# Patient Record
Sex: Female | Born: 1990 | Race: Asian | Hispanic: No | Marital: Single | Smoking: Never smoker
Health system: Southern US, Community
[De-identification: ages and names within clinical notes are randomized; demographics above are authoritative.]

---
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
## 2012-06-20 ENCOUNTER — Emergency Department (HOSPITAL_COMMUNITY)
Admission: EM | Admit: 2012-06-20 | Discharge: 2012-06-20 | Disposition: A | Discharge status: Home / Self Care | Payer: Self-pay | Source: Home / Self Care | Attending: Family Medicine | Admitting: Family Medicine

## 2012-06-20 ENCOUNTER — Emergency Department (HOSPITAL_COMMUNITY): Payer: Self-pay

## 2012-06-20 ENCOUNTER — Encounter (HOSPITAL_COMMUNITY): Payer: Self-pay | Admitting: *Deleted

## 2012-06-20 DIAGNOSIS — M940 Chondrocostal junction syndrome [Tietze]: Secondary | ICD-10-CM

## 2012-06-20 MED ORDER — TRAMADOL HCL 50 MG PO TABS: 50.0000 mg | ORAL_TABLET | Freq: Four times a day (QID) | ORAL | Status: DC | PRN

## 2012-06-20 MED ORDER — IBUPROFEN 600 MG PO TABS: 600.0000 mg | ORAL_TABLET | Freq: Three times a day (TID) | ORAL | Status: DC | PRN

## 2012-06-20 NOTE — ED Notes (Signed)
C/o  R ant. chest pain onset Sunday when she woke up.  Denies any injury or activity that would have caused chest pain.  Denies cold symptoms.  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  Has SOB when she tries to talk.  She gets excited and talking fast.  No nausea or sweating.  It moves around in her R chest but no radiation.   Pain when she palpates it or moves.

## 2012-06-20 NOTE — ED Provider Notes (Signed)
History     CSN: [id number]  Arrival date & time 06/20/12  1744   First MD Initiated Contact with Patient 06/20/12 1905      Chief Complaint  Patient presents with  . Chest Pain    (Consider location/radiation/quality/duration/timing/severity/associated sxs/prior treatment) HPI Comments: 22 year old female with no significant past medical history. Here complaining of right side upper chest pain for 3 days. Denies direct injury or recent falls. Denies cough or congestion. No fever or chills. Denies shortness of breath. Patient states that she has a history of getting short of breath when she talks fast. Pain is triggered by touching her tender area and with his right arm and shoulder movement. Denies diaphoresis, nausea or dizziness. Denies acid reflux. Denies abdominal pain.   History reviewed. No pertinent past medical history.  History reviewed. No pertinent past surgical history.  History reviewed. No pertinent family history.  History  Substance Use Topics  . Smoking status: Never Smoker   . Smokeless tobacco: Not on file  . Alcohol Use: Not on file    OB History   Grav Para Term Preterm Abortions TAB SAB Ect Mult Living                  Review of Systems  Constitutional: Negative for fever, chills, diaphoresis, activity change, appetite change and fatigue.  Respiratory: Negative for cough, chest tightness, shortness of breath and wheezing.   Cardiovascular: Positive for chest pain. Negative for palpitations and leg swelling.  Gastrointestinal: Negative for nausea, vomiting, abdominal pain and diarrhea.  Endocrine: Negative for cold intolerance, heat intolerance, polydipsia, polyphagia and polyuria.  Genitourinary: Negative for dysuria and flank pain.  Musculoskeletal: Negative for back pain.  Skin: Negative for rash and wound.  Neurological: Negative for dizziness and headaches.  All other systems reviewed and are negative.    Allergies  Review of patient's  allergies indicates no known allergies.  Home Medications   Current Outpatient Rx  Name  Route  Sig  Dispense  Refill  . ibuprofen (ADVIL,MOTRIN) 600 MG tablet   Oral   Take 1 tablet (600 mg total) by mouth every 8 (eight) hours as needed for pain.   20 tablet   0   . traMADol (ULTRAM) 50 MG tablet   Oral   Take 1 tablet (50 mg total) by mouth every 6 (six) hours as needed for pain.   15 tablet   0     BP 140/90  Pulse 85  Temp(Src) 98.4 F (36.9 C) (Oral)  Resp 16  SpO2 100%  LMP 06/16/2012  Physical Exam  Nursing note and vitals reviewed. Constitutional: She is oriented to person, place, and time. She appears well-developed and well-nourished. No distress.  HENT:  Head: Normocephalic and atraumatic.  Mouth/Throat: Oropharynx is clear and moist. No oropharyngeal exudate.  Eyes: Conjunctivae are normal. No scleral icterus.  Neck: Neck supple. No JVD present. No thyromegaly present.  Cardiovascular: Normal rate, regular rhythm, normal heart sounds and intact distal pulses.  Exam reveals no gallop and no friction rub.   No murmur heard. Pulmonary/Chest: Effort normal and breath sounds normal. No respiratory distress. She has no wheezes. She has no rales.  Abdominal: Soft. There is no tenderness.  Musculoskeletal:  There is focal tenderness with palpation over right mid sternocostal area. No crepitus. No bruising. No obvious swelling. Pain worse with right arm/shoulder abduction.   Lymphadenopathy:    She has no cervical adenopathy.  Neurological: She is alert and oriented to person,  place, and time.  Skin: No rash noted. She is not diaphoretic.    ED Course  Procedures (including critical care time)  Labs Reviewed - No data to display No results found.   1. Costochondritis, acute     EKG: Normal sinus rhythm with ventricular rate at 92 beats per minute. No ST or other acute ischemic changes.  MDM  Treated with ibuprofen and tramadol. [facility] to [facility]ablish primary care. Supportive care and red flags that should prompt return to medical attention discussed with patient and provided in writing.         Sharin Grave, MD 06/22/12 [phone]

## 2012-06-20 NOTE — ED Notes (Signed)
[facility] rad tech informed me that pt. refused chest xray.  She said pt. was upset that the doctor did not do anything except press on her chest. Pt. was discharged by [facility] before I had a chance to talk with her about it.

## 2013-04-24 ENCOUNTER — Emergency Department (HOSPITAL_COMMUNITY)
Admission: EM | Admit: 2013-04-24 | Discharge: 2013-04-24 | Disposition: A | Discharge status: Home / Self Care | Payer: Self-pay | Attending: Emergency Medicine | Admitting: Emergency Medicine

## 2013-04-24 ENCOUNTER — Encounter (HOSPITAL_COMMUNITY): Payer: Self-pay | Admitting: Emergency Medicine

## 2013-04-24 DIAGNOSIS — R590 Localized enlarged lymph nodes: Secondary | ICD-10-CM

## 2013-04-24 DIAGNOSIS — J039 Acute tonsillitis, unspecified: Secondary | ICD-10-CM

## 2013-04-24 DIAGNOSIS — R0602 Shortness of breath: Secondary | ICD-10-CM | POA: Insufficient documentation

## 2013-04-24 LAB — MONONUCLEOSIS SCREEN: MONO SCREEN: NEGATIVE

## 2013-04-24 LAB — COMPREHENSIVE METABOLIC PANEL
ALT: 11 U/L (ref 0–35)
AST: 15 U/L (ref 0–37)
Albumin: 4 g/dL (ref 3.5–5.2)
Alkaline Phosphatase: 77 U/L (ref 39–117)
BUN: 11 mg/dL (ref 6–23)
CALCIUM: 9.6 mg/dL (ref 8.4–10.5)
CO2: 26 mEq/L (ref 19–32)
Chloride: 98 mEq/L (ref 96–112)
Creatinine, Ser: 0.61 mg/dL (ref 0.50–1.10)
GFR calc non Af Amer: >90 mL/min (ref >90)
GLUCOSE: 93 mg/dL (ref 70–99)
Potassium: 4.4 mEq/L (ref 3.7–5.3)
SODIUM: 138 meq/L (ref 137–147)
TOTAL PROTEIN: 8.4 g/dL — AB (ref 6.0–8.3)
Total Bilirubin: 0.6 mg/dL (ref 0.3–1.2)

## 2013-04-24 LAB — CBC
HCT: 37.7 % (ref 36.0–46.0)
HEMOGLOBIN: 12.8 g/dL (ref 12.0–15.0)
MCH: 24.3 pg — AB (ref 26.0–34.0)
MCHC: 34 g/dL (ref 30.0–36.0)
MCV: 71.7 fL — ABNORMAL LOW (ref 78.0–100.0)
Platelets: 341 10*3/uL (ref 150–400)
RBC: 5.26 MIL/uL — ABNORMAL HIGH (ref 3.87–5.11)
RDW: 14.1 % (ref 11.5–15.5)
WBC: 12.5 10*3/uL — ABNORMAL HIGH (ref 4.0–10.5)

## 2013-04-24 LAB — RAPID STREP SCREEN (MED CTR MEBANE ONLY): STREPTOCOCCUS, GROUP A SCREEN (DIRECT): NEGATIVE

## 2013-04-24 MED ORDER — DEXAMETHASONE SODIUM PHOSPHATE 10 MG/ML IJ SOLN
10.0000 mg | Freq: Once | INTRAMUSCULAR | Status: AC
  Administered 2013-04-24: 10 mg
  Filled 2013-04-24: qty 1

## 2013-04-24 NOTE — Discharge Instructions (Signed)
If you were given medicines take as directed.  If you are on coumadin or contraceptives realize their levels and effectiveness is altered by many different medicines.  If you have any reaction (rash, tongues swelling, other) to the medicines stop taking and see a physician.   Please follow up as directed and return to the ER or see a physician for new or worsening symptoms.  Thank you. Filed Vitals:   04/24/13 1334  BP: 137/97  Pulse: 80  Temp: 98 F (36.7 C)  TempSrc: Oral  Resp: 22  SpO2: 100%

## 2013-04-24 NOTE — ED Notes (Addendum)
Pt reports sore throat x 5 days and "knots" on side of neck x 3 days. Denies fever/chills/HA but states pain with neck movement lasting 3 days and resolving this AM. Tonsils red, enlarged. States "I feel like I can't breathe if I lay flat." Speaks in complete sentences, NAD.

## 2013-04-24 NOTE — ED Provider Notes (Signed)
CSN: [id number]     Arrival date & time 04/24/13  1330 History   First MD Initiated Contact with Patient 04/24/13 1501     Chief Complaint  Patient presents with  . Sore Throat  . Cyst     (Consider location/radiation/quality/duration/timing/severity/associated sxs/prior Treatment) HPI Comments: 23 yo female with no medical problems presents with sore throat and right neck "knot" for 4-5 days.  No voice change.  No travel or sick contacts.  No hx of sore throat.  No drooling.  Vaccines UTD as a child.  Pt taking tylenol and motrin with relief. Mild sob with lying flat  Patient is a 23 y.o. female presenting with pharyngitis. The history is provided by the patient.  Sore Throat Associated symptoms include shortness of breath. Pertinent negatives include no chest pain, no abdominal pain and no headaches.    No past medical history on file. No past surgical history on file. No family history on file. History  Substance Use Topics  . Smoking status: Never Smoker   . Smokeless tobacco: Not on file  . Alcohol Use: Yes     Comment: occasionally    OB History   Grav Para Term Preterm Abortions TAB SAB Ect Mult Living                 Review of Systems  Constitutional: Negative for fever and chills.  HENT: Positive for sore throat. Negative for congestion, drooling, ear discharge, rhinorrhea, trouble swallowing and voice change.   Respiratory: Positive for shortness of breath.   Cardiovascular: Negative for chest pain.  Gastrointestinal: Negative for vomiting and abdominal pain.  Genitourinary: Negative for dysuria and flank pain.  Musculoskeletal: Negative for back pain, neck pain and neck stiffness.  Skin: Negative for rash.  Neurological: Negative for light-headedness and headaches.      Allergies  Review of patient's allergies indicates no known allergies.  Home Medications  No current outpatient prescriptions on file. BP 137/97  Pulse 80  Temp(Src) 98 F (36.7 C)  (Oral)  Resp 22  SpO2 100%  LMP 04/24/2013 Physical Exam  Nursing note and vitals reviewed. Constitutional: She is oriented to person, place, and time. She appears well-developed and well-nourished.  HENT:  Head: Normocephalic and atraumatic.  3-4+ tonsils, no exudate, mild erythema posterior. No trismus, uvular deviation, unilateral posterior pharyngeal edema or submandibular swelling. Right posterior cervical lymphadenopathy No cyst or abscess appreciated Neck supple, no meningismus   Eyes: Conjunctivae are normal. Right eye exhibits no discharge. Left eye exhibits no discharge.  Neck: Normal range of motion. Neck supple. No tracheal deviation present.  Cardiovascular: Normal rate and regular rhythm.   Pulmonary/Chest: Effort normal and breath sounds normal.  Musculoskeletal: She exhibits no edema.  Neurological: She is alert and oriented to person, place, and time.  Skin: Skin is warm. No rash noted.  Psychiatric: She has a normal mood and affect.    ED Course  Procedures (including critical care time) Labs Review Labs Reviewed  CBC - Abnormal; Notable for the following:    WBC 12.5 (*)    RBC 5.26 (*)    MCV 71.7 (*)    MCH 24.3 (*)    All other components within normal limits  COMPREHENSIVE METABOLIC PANEL - Abnormal; Notable for the following:    Total Protein 8.4 (*)    All other components within normal limits  RAPID STREP SCREEN  CULTURE, GROUP A STREP  MONONUCLEOSIS SCREEN   Imaging Review No results found.  EKG Interpretation None      MDM   Final diagnoses:  Tonsillitis  Cervical lymphadenopathy   Well appearing. No red flags, no stridor, pt does not want pain medicines. Discussed differential.  No signs of PTA, retro abscess or meningitis.  Lymphadenopathy and Tonsillitis as diagnosis. Fup with ENT.  Strict return instructions given. Discussed r/b of CT scan, pt okay with waiting 2-3 days and will return if worsening or no improvement for  imaging at that time. [place] given.  Fup discussed Results and differential diagnosis were discussed with the patient. Close follow up outpatient was discussed, patient comfortable with the plan.   Filed Vitals:   04/24/13 1334  BP: 137/97  Pulse: 80  Temp: 98 F (36.7 C)  TempSrc: Oral  Resp: 22  SpO2: 100%         Enid SkeensJoshua M Shanesha Bednarz, MD 04/24/13 1616

## 2013-04-24 NOTE — ED Notes (Signed)
Called pharmacist to verify IV decadron can be given PO. Pharmacist said this is ok to do.

## 2013-04-26 LAB — CULTURE, GROUP A STREP

## 2015-07-16 ENCOUNTER — Encounter (HOSPITAL_COMMUNITY): Payer: Self-pay | Admitting: Emergency Medicine

## 2015-07-16 ENCOUNTER — Ambulatory Visit (HOSPITAL_COMMUNITY)
Admission: EM | Admit: 2015-07-16 | Discharge: 2015-07-16 | Disposition: A | Discharge status: Home / Self Care | Payer: Self-pay | Attending: Emergency Medicine | Admitting: Emergency Medicine

## 2015-07-16 DIAGNOSIS — T148 Other injury of unspecified body region: Secondary | ICD-10-CM

## 2015-07-16 DIAGNOSIS — W57XXXA Bitten or stung by nonvenomous insect and other nonvenomous arthropods, initial encounter: Secondary | ICD-10-CM

## 2015-07-16 MED ORDER — CEPHALEXIN 500 MG PO CAPS: 500.0000 mg | ORAL_CAPSULE | Freq: Four times a day (QID) | ORAL | Status: DC

## 2015-07-16 MED ORDER — HYDROCORTISONE 1 % EX CREA: TOPICAL_CREAM | CUTANEOUS | Status: DC

## 2015-07-16 MED ORDER — HYDROXYZINE HCL 25 MG PO TABS: 25.0000 mg | ORAL_TABLET | Freq: Four times a day (QID) | ORAL | Status: DC

## 2015-07-16 NOTE — Discharge Instructions (Signed)
DEET Insect Repellent  °DEET is a commonly used insect repellent. DEET is effective against mosquitoes, ticks, and chiggers. DEET is not effective against stinging insects, such as bees and wasps. When mosquitoes or ticks are active, take the following precautions. °· Use DEET according to the directions on the label. °· Wear protective clothing if you are outside in an area where there are weeds, tall grass, or bushes. This includes long pants, socks, and loose-fitting, long-sleeved shirts. Consider spraying DEET on your clothing. Avoid being outdoors in the early evening. This is when mosquitoes are most active. °· Products with a low concentration of DEET (10% to 20%) may be useful in areas with few insects. Higher concentrations of DEET may be needed in areas with many insects. Repellents used on children should not contain more than 30% DEET. Although higher concentrations of DEET (up to 95%) are available for adults, they are not recommended for routine use. Concentrations higher than 50% do not provide additional protection. Depending on the concentration of DEET in a product, it can be effective for about 2 to 6 hours. °· When applying DEET to children, use the lowest concentration that is effective. Ten percent DEET will last approximately 2 to 3 hours, while 30% will last 4 to 5 hours. Do not use DEET on infants younger than 2 months old. Do not apply DEET more often than once a day to children under the age of 2. °· Avoid prolonged or excessive use of DEET. Use it sparingly to cover exposed skin and clothing. Adverse reactions to DEET in the recommended concentrations are uncommon. However, skin irritation can occur in some people. °· Wash all treated skin and clothing with soap and water after returning indoors. °· Do not allow children to apply insect repellent themselves. °· Do not apply DEET near cuts or open wounds. You can apply DEET and sunscreen together. However, it is recommend that you apply  the sunscreen first. °· Do not apply DEET to a child's hands or near a child's eyes and mouth. If DEET is accidentally sprayed in the eyes, wash the eyes out with large amounts of water. °· Store DEET out of the reach of children. °· Most authorities feel that it is safe to use DEET during pregnancy. However, pregnant women should only use insect repellents when they are in areas with a high risk of disease carried by insects (malaria, West Nile virus, encephalitis). °  °This information is not intended to replace advice given to you by your health care provider. Make sure you discuss any questions you have with your health care provider. °  °Document Released: 10/19/2000 Document Revised: 02/14/2014 Document Reviewed: 08/28/2014 °Elsevier Interactive Patient Education ©2016 [employer] ° °

## 2015-07-16 NOTE — ED Provider Notes (Signed)
CSN: [id number]     Arrival date & time 07/16/15  1811 History   First MD Initiated Contact with Patient 07/16/15 1902     Chief Complaint  Patient presents with  . Rash   (Consider location/radiation/quality/duration/timing/severity/associated sxs/prior Treatment) HPI History obtained from patient:  Pt presents with the cc of:  Bug bites Duration of symptoms: One day Treatment prior to arrival: No treatment Context: Awoke this morning with multiple bug bites on the left arm and leg itching Other symptoms include: Itching Pain score: No pain FAMILY HISTORY: No significant family history    History reviewed. No pertinent past medical history. History reviewed. No pertinent past surgical history. No family history on file. Social History  Substance Use Topics  . Smoking status: Never Smoker   . Smokeless tobacco: None  . Alcohol Use: Yes     Comment: occasionally    OB History    No data available     Review of Systems  Denies: HEADACHE, NAUSEA, ABDOMINAL PAIN, CHEST PAIN, CONGESTION, DYSURIA, SHORTNESS OF BREATH  Allergies  Review of patient's allergies indicates no known allergies.  Home Medications   Prior to Admission medications   Medication Sig Start Date End Date Taking? Authorizing Provider  cephALEXin (KEFLEX) 500 MG capsule Take 1 capsule (500 mg total) by mouth 4 (four) times daily. 07/16/15   Tharon AquasFrank C Atul Delucia, PA  hydrocortisone cream 1 % Apply to affected area 2 times daily 07/16/15   Tharon AquasFrank C Tyee Vandevoorde, PA  hydrOXYzine (ATARAX/VISTARIL) 25 MG tablet Take 1 tablet (25 mg total) by mouth every 6 (six) hours. 07/16/15   Tharon AquasFrank C Cason Dabney, PA   Meds Ordered and Administered this Visit  Medications - No data to display  BP 141/98 mmHg  Pulse 77  Temp(Src) 98.1 F (36.7 C) (Oral)  SpO2 99% No data found.   Physical Exam NURSES NOTES AND VITAL SIGNS REVIEWED. CONSTITUTIONAL: Well developed, well nourished, no acute distress HEENT: normocephalic, atraumatic EYES:  Conjunctiva normal NECK:normal ROM, supple, no adenopathy PULMONARY:No respiratory distress, normal effort ABDOMINAL: Soft, ND, NT BS+, No CVAT MUSCULOSKELETAL: Normal ROM of all extremities,  SKIN: warm and dry several red bug bites noted on the left arm and left leg. No signs of cellulitis. PSYCHIATRIC: Mood and affect, behavior are normal  ED Course  Procedures (including critical care time)  Labs Review Labs Reviewed - No data to display  Imaging Review No results found.   Visual Acuity Review  Right Eye Distance:   Left Eye Distance:   Bilateral Distance:    Right Eye Near:   Left Eye Near:    Bilateral Near:       It is unclear where the bug bites originated but I expect patient to make full recovery Prescription for Keflex and Atarax is provided.  MDM   1. Bug bites     Patient is reassured that there are no issues that require transfer to higher level of care at this time or additional tests. Patient is advised to continue home symptomatic treatment. Patient is advised that if there are new or worsening symptoms to attend the emergency department, contact primary care provider, or return to UC. Instructions of care provided discharged home in stable condition.    THIS NOTE WAS GENERATED USING A VOICE RECOGNITION SOFTWARE PROGRAM. ALL REASONABLE EFFORTS  WERE MADE TO PROOFREAD THIS DOCUMENT FOR ACCURACY.  I have verbally reviewed the discharge instructions with the patient. A printed AVS was given to the patient.  All questions  were answered prior to discharge.      Tharon Aquas, [place] 07/16/15 2034

## 2015-07-16 NOTE — ED Notes (Signed)
Patient c/o rash that began last night on her left forearm. It has now spread to her back and abdomen, she has not looked at her legs but has felt itchy. Patient reports she has not had any changes to her diet, no new medications, or hygiene products in the last few days. Denies SOB or difficulty breathing. She is in NAD.

## 2017-01-16 ENCOUNTER — Other Ambulatory Visit: Payer: Self-pay

## 2017-01-16 ENCOUNTER — Encounter (HOSPITAL_COMMUNITY): Payer: Self-pay

## 2017-01-16 ENCOUNTER — Emergency Department (HOSPITAL_COMMUNITY)
Admission: EM | Admit: 2017-01-16 | Discharge: 2017-01-17 | Disposition: A | Discharge status: Home / Self Care | Payer: Self-pay | Attending: Emergency Medicine | Admitting: Emergency Medicine

## 2017-01-16 DIAGNOSIS — O209 Hemorrhage in early pregnancy, unspecified: Secondary | ICD-10-CM | POA: Insufficient documentation

## 2017-01-16 DIAGNOSIS — Z3A01 Less than 8 weeks gestation of pregnancy: Secondary | ICD-10-CM | POA: Insufficient documentation

## 2017-01-16 DIAGNOSIS — O9989 Other specified diseases and conditions complicating pregnancy, childbirth and the puerperium: Secondary | ICD-10-CM | POA: Insufficient documentation

## 2017-01-16 DIAGNOSIS — Z3201 Encounter for pregnancy test, result positive: Secondary | ICD-10-CM

## 2017-01-16 DIAGNOSIS — J029 Acute pharyngitis, unspecified: Secondary | ICD-10-CM | POA: Insufficient documentation

## 2017-01-16 LAB — RAPID STREP SCREEN (MED CTR MEBANE ONLY): Streptococcus, Group A Screen (Direct): NEGATIVE

## 2017-01-16 LAB — POC URINE PREG, ED: Preg Test, Ur: POSITIVE — AB

## 2017-01-16 MED ORDER — IBUPROFEN 400 MG PO TABS
600.0000 mg | ORAL_TABLET | Freq: Once | ORAL | Status: AC
  Administered 2017-01-16: 600 mg via ORAL
  Filled 2017-01-16: qty 1

## 2017-01-16 NOTE — Discharge Instructions (Addendum)
You had a positive pregnancy test today. Your HCG level was 48. You will need to have a follow up HCG quantitative test performed in 2-3 days. [facility].  Your rapid strep test was negative. Your symptoms are consistent with a viral illness. Viruses do not require antibiotics. Treatment is symptomatic care and it is important to note that these symptoms may last for 7-14 days.   Hand washing: Wash your hands throughout the day, but especially before and after touching the face, using the restroom, sneezing, coughing, or touching surfaces that have been coughed or sneezed upon. Hydration: Symptoms will be intensified and complicated by dehydration. Dehydration can also extend the duration of symptoms. Drink plenty of fluids and get plenty of rest. You should be drinking at least half a liter of water an hour to stay hydrated. Electrolyte drinks (ex. Gatorade, Powerade, Pedialyte) are also encouraged. You should be drinking enough fluids to make your urine light yellow, almost clear. If this is not the case, you are not drinking enough water. Please note that some of the treatments indicated below will not be effective if you are not adequately hydrated. Pain or fever: Tylenol for pain or fever.  Congestion: Saline sinus rinses and saline nasal sprays may also help relieve congestion. Sore throat: Warm liquids or Chloraseptic spray may help soothe a sore throat. Gargle twice a day with a salt water solution made from a half teaspoon of salt in a cup of warm water.  Follow up: Follow up with a primary care provider, as needed, for any future management of this issue.  Return: Return to the ED should symptoms worsen.

## 2017-01-16 NOTE — ED Provider Notes (Signed)
[facility] Provider Note   CSN: [id number] Arrival date & time: 01/16/17  2246     History   Chief Complaint Chief Complaint  Patient presents with  . Sore Throat    HPI [name] is a 26 y.o. female.  HPI   [name] is a 26 y.o. female, patient with no pertinent past medical history, presenting to the ED with sore throat for the last 5 days.  Pain is sharp, bilateral but worse on the left, minor when at rest, but 10/10 with swallowing.   She has been drinking warm tea with honey as well as gargling with salt water.  Recurrent sore throats.  Denies known fever, cough, N/V/D, difficulty breathing or swallowing, drooling, chest pain, abdominal pain, rashes, or any other complaints.  Last normal menstrual cycle was Nov 30, 2016. Denies current method of contraception. Endorses intermittent spotting vaginal bleeding beginning Nov 24, which she states is abnormal for her. States her last sexual contact was "about a month ago."  History reviewed. No pertinent past medical history.  There are no active problems to display for this patient.   History reviewed. No pertinent surgical history.  OB History    No data available       Home Medications    Prior to Admission medications   Not on File    Family History History reviewed. No pertinent family history.  Social History Social History   Tobacco Use  . Smoking status: Never Smoker  Substance Use Topics  . Alcohol use: Yes    Comment: occasionally   . Drug use: No     Allergies   Patient has no known allergies.   Review of Systems Review of Systems  Constitutional: Negative for fever.  HENT: Positive for sore throat. Negative for facial swelling, trouble swallowing and voice change.   Respiratory: Negative for cough and shortness of breath.   Cardiovascular: Negative for chest pain.  Gastrointestinal: Negative for abdominal pain, diarrhea, nausea and vomiting.  All  other systems reviewed and are negative.    Physical Exam Updated Vital Signs BP (!) 143/104 (BP Location: Right Arm)   Pulse 98   Temp (!) 100.9 F (38.3 C) (Oral)   Resp 16   Ht 5\' 4"  (1.626 m)   Wt 63.5 kg (140 lb)   LMP 01/16/2017 (Exact Date)   SpO2 100%   BMI 24.03 kg/m   Physical Exam  Constitutional: She appears well-developed and well-nourished. No distress.  HENT:  Head: Normocephalic and atraumatic.  Mouth/Throat: Uvula is midline and mucous membranes are normal. No trismus in the jaw. Posterior oropharyngeal edema and posterior oropharyngeal erythema present. No oropharyngeal exudate.  Swelling to the posterior oropharynx equal bilaterally.  No trismus.  No submental or submandibular swelling.  Patient handles oral secretions without difficulty.  Mouth opening to at least 3 finger widths.  Eyes: Conjunctivae are normal.  Neck: Normal range of motion. Neck supple.  Cardiovascular: Normal rate, regular rhythm, normal heart sounds and intact distal pulses.  Pulmonary/Chest: Effort normal and breath sounds normal. No respiratory distress.  Abdominal: Soft. There is no tenderness. There is no guarding.  Musculoskeletal: She exhibits no edema.  Lymphadenopathy:    She has cervical adenopathy.  Neurological: She is alert.  Skin: Skin is warm and dry. She is not diaphoretic.  Psychiatric: She has a normal mood and affect. Her behavior is normal.  Nursing note and vitals reviewed.    ED Treatments / Results  Labs (all labs ordered are listed, but only abnormal results are displayed) Labs Reviewed  HCG, QUANTITATIVE, PREGNANCY - Abnormal; Notable for the following components:      Result Value   hCG, Beta Chain, Quant, S 48 (*)    All other components within normal limits  BASIC METABOLIC PANEL - Abnormal; Notable for the following components:   Sodium 133 (*)    Chloride 100 (*)    Glucose, Bld 103 (*)    All other components within normal limits  CBC WITH  DIFFERENTIAL/PLATELET - Abnormal; Notable for the following components:   WBC 21.8 (*)    MCV 72.9 (*)    MCH 24.8 (*)    Neutro Abs 16.6 (*)    Monocytes Absolute 2.6 (*)    All other components within normal limits  POC URINE PREG, ED - Abnormal; Notable for the following components:   Preg Test, Ur POSITIVE (*)    All other components within normal limits  RAPID STREP SCREEN (NOT AT [facility])  CULTURE, GROUP A STREP [facility])  ABO/RH    EKG  EKG Interpretation None       Radiology No results found.  Procedures Procedures (including critical care time)  Medications Ordered in ED Medications  ibuprofen (ADVIL,MOTRIN) tablet 600 mg (600 mg Oral Given 01/16/17 2347)     Initial Impression / Assessment and Plan / ED Course  I have reviewed the triage vital signs and the nursing notes.  Pertinent labs & imaging results that were available during my care of the patient were reviewed by me and considered in my medical decision making (see chart for details).  Clinical Course as of Jan 17 126  Tue Jan 17, 2017  0008 Discussed positive pregnancy test with patient.   [SJ]    Clinical Course User Index [SJ] Joy, Shawn C, PA-C     Patient presents with sore throat.  Bilateral swelling without evidence of trismus.  Low suspicion for abscess.  Rapid strep negative.   Urine pregnancy positive.  Rh+.  HCG quant of 48.  Hemoglobin normal.  Repeat quant in 2-3 days. The patient was given instructions for home care as well as return precautions. Patient voices understanding of these instructions, accepts the plan, and is comfortable with discharge.  Findings and plan of care discussed with Dione Boozeavid Glick, MD.   Vitals:   01/16/17 2252 01/17/17 0125  BP: (!) 143/104 (!) 127/95  Pulse: 98 91  Resp: 16 12  Temp: (!) 100.9 F (38.3 C)   TempSrc: Oral   SpO2: 100% 100%  Weight: 63.5 kg (140 lb)   Height: 5\' 4"  (1.626 m)      Final Clinical Impressions(s) / ED Diagnoses   Final  diagnoses:  Pharyngitis, unspecified etiology  Positive pregnancy test    ED Discharge Orders    None       Concepcion LivingJoy, Shawn C, PA-C 01/17/17 0127    Dione BoozeGlick, David, MD 01/17/17 [id number]

## 2017-01-16 NOTE — ED Triage Notes (Signed)
Pt endorses swollen tonsils/sore throat x 5 days. Pt had hx of same 3 years ago and was placed on steroids and it went away. 100.9 oral temp. Airway intact.

## 2017-01-17 LAB — ABO/RH: ABO/RH(D): A POS

## 2017-01-17 LAB — BASIC METABOLIC PANEL
Anion gap: 8 (ref 5–15)
BUN: 6 mg/dL (ref 6–20)
CALCIUM: 9 mg/dL (ref 8.9–10.3)
CO2: 25 mmol/L (ref 22–32)
Chloride: 100 mmol/L — ABNORMAL LOW (ref 101–111)
Creatinine, Ser: 0.68 mg/dL (ref 0.44–1.00)
GFR calc non Af Amer: >60 mL/min (ref >60)
Glucose, Bld: 103 mg/dL — ABNORMAL HIGH (ref 65–99)
POTASSIUM: 3.5 mmol/L (ref 3.5–5.1)
Sodium: 133 mmol/L — ABNORMAL LOW (ref 135–145)

## 2017-01-17 LAB — CULTURE, GROUP A STREP (THRC)

## 2017-01-17 LAB — CBC WITH DIFFERENTIAL/PLATELET
BASOS ABS: 0 10*3/uL (ref 0.0–0.1)
Basophils Relative: 0 %
EOS ABS: 0.2 10*3/uL (ref 0.0–0.7)
Eosinophils Relative: 1 %
HCT: 36.5 % (ref 36.0–46.0)
Hemoglobin: 12.4 g/dL (ref 12.0–15.0)
Lymphocytes Relative: 11 %
Lymphs Abs: 2.4 10*3/uL (ref 0.7–4.0)
MCH: 24.8 pg — ABNORMAL LOW (ref 26.0–34.0)
MCHC: 34 g/dL (ref 30.0–36.0)
MCV: 72.9 fL — ABNORMAL LOW (ref 78.0–100.0)
Monocytes Absolute: 2.6 10*3/uL — ABNORMAL HIGH (ref 0.1–1.0)
Monocytes Relative: 12 %
Neutro Abs: 16.6 10*3/uL — ABNORMAL HIGH (ref 1.7–7.7)
Neutrophils Relative %: 76 %
Platelets: 274 10*3/uL (ref 150–400)
RBC: 5.01 MIL/uL (ref 3.87–5.11)
RDW: 14 % (ref 11.5–15.5)
WBC: 21.8 10*3/uL — AB (ref 4.0–10.5)

## 2017-01-17 LAB — HCG, QUANTITATIVE, PREGNANCY: hCG, Beta Chain, Quant, S: 48 m[IU]/mL — ABNORMAL HIGH (ref <5)

## 2017-01-18 ENCOUNTER — Telehealth: Payer: Self-pay | Admitting: Emergency Medicine

## 2017-01-18 NOTE — Progress Notes (Signed)
ED Antimicrobial Stewardship Positive Culture Follow Up   [name] is an 26 y.o. female who [facility] with a chief complaint of  Chief Complaint  Patient presents with  . Sore Throat    Recent Results (from the past 720 hour(s))  Rapid strep screen     Status: None   Collection Time: 01/16/17 10:53 PM  Result Value Ref Range Status   Streptococcus, Group A Screen (Direct) NEGATIVE NEGATIVE Final    Comment: (NOTE) A Rapid Antigen test may result negative if the antigen level in the sample is below the detection level of this test. The FDA has not cleared this test as a stand-alone test therefore the rapid antigen negative result has reflexed to a Group A Strep culture.   Culture, group A strep     Status: None   Collection Time: 01/16/17 10:53 PM  Result Value Ref Range Status   Specimen Description THROAT  Final   Special Requests NONE Reflexed from [id number]  Final   Culture FEW GROUP A STREP (S.PYOGENES) ISOLATED  Final   Report Status 01/17/2017 FINAL  Final   Rapid strep neg cx pos  New antibiotic prescription: amox 500 bid x 10 d  ED Provider: Jodi GeraldsKelsey Ford, Langston MaskerPA-C  Madonna Flegal A Enid Maultsby 01/18/2017, 8:43 AM Infectious Diseases Pharmacist Phone# [phone]

## 2017-01-18 NOTE — Telephone Encounter (Signed)
Post ED Visit - Positive Culture Follow-up: Successful Patient Follow-Up  Culture assessed and recommendations reviewed by: []  Enzo BiNathan Batchelder, Pharm.D. []  Celedonio MiyamotoJeremy Frens, Pharm.D., BCPS AQ-ID []  Garvin FilaMike Maccia, Pharm.D., BCPS []  Georgina PillionElizabeth Martin, [address]Pharm[address]D., BCPS []  [place]h Pham, [place]arm[place]D., BCPS, AAHIVP []  Estella HuskMichelle Turner, Pharm.D., BCPS, AAHIVP []  Lysle Pearlachel Rumbarger, PharmD, BCPS []  Casilda Carlsaylor Stone, PharmD, BCPS []  Pollyann SamplesAndy Johnston, PharmD, BCPS  Positive strep culture  [x]  Patient discharged without antimicrobial prescription and treatment is now indicated []  Organism is resistant to prescribed ED discharge antimicrobial []  Patient with positive blood cultures  Changes discussed with ED provider: Jodi GeraldsKelsey Ford PA New antibiotic prescription Amoxicillin 500mg  po bid x 10 days  Attempting to contact patient   Berle MullMiller, Lena Gores 01/18/2017, 10:23 AM

## 2017-12-11 ENCOUNTER — Telehealth: Payer: Self-pay | Admitting: Emergency Medicine

## 2017-12-11 NOTE — Telephone Encounter (Signed)
Lost to followup
